# Patient Record
Sex: Male | Born: 1975 | Race: White | Hispanic: No | Marital: Married | Smoking: Never smoker
Health system: Southern US, Community
[De-identification: ages and names within clinical notes are randomized; demographics above are authoritative.]

## PROBLEM LIST (undated history)

## (undated) HISTORY — PX: FRACTURE SURGERY: SHX138

## (undated) HISTORY — PX: CHOLECYSTECTOMY: SHX55

---

## 2006-07-08 ENCOUNTER — Emergency Department: Payer: Self-pay | Admitting: Internal Medicine

## 2013-10-28 ENCOUNTER — Ambulatory Visit: Payer: Self-pay | Admitting: Gastroenterology

## 2014-02-11 ENCOUNTER — Ambulatory Visit: Payer: Self-pay | Admitting: Surgery

## 2014-02-12 LAB — PATHOLOGY REPORT

## 2014-04-21 IMAGING — US ABDOMEN ULTRASOUND
1 series · 14 of 25 positions shown · non-contrast
Comparison: None.

CLINICAL DATA: Right upper quadrant abdominal pain

EXAM:
ULTRASOUND ABDOMEN COMPLETE

[Series 1: abdomen ultrasound · 0.26mm/px · 14 of 96 slices shown]
[im 1/96]
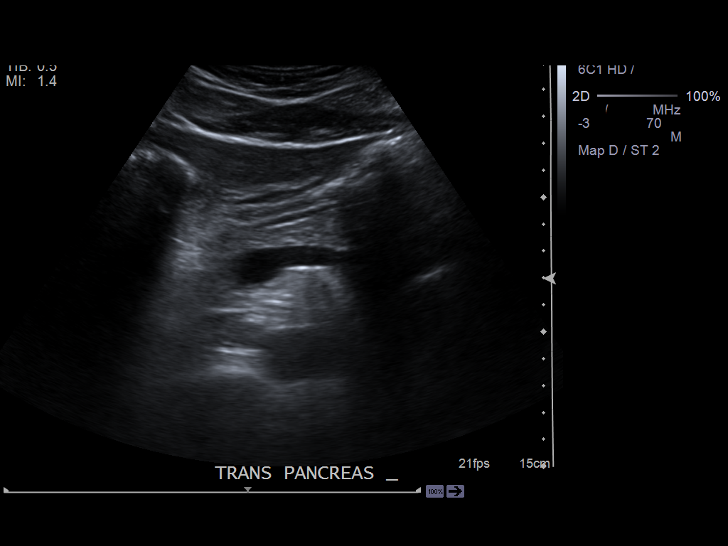
[im 8/96]
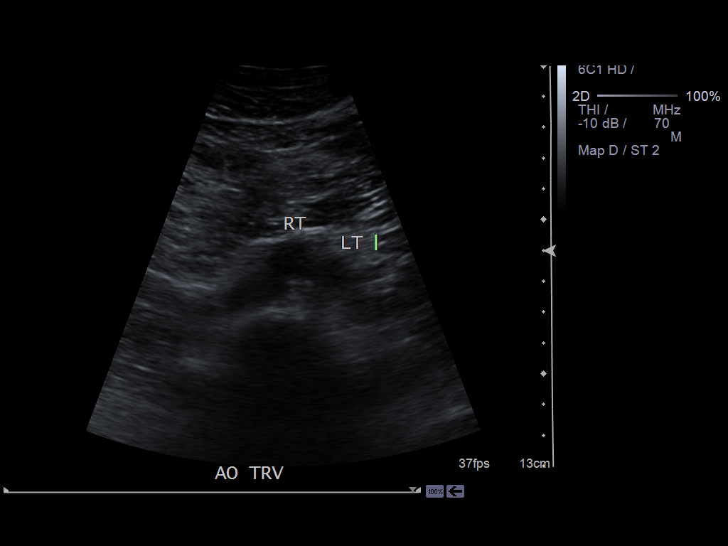
[im 16/96]
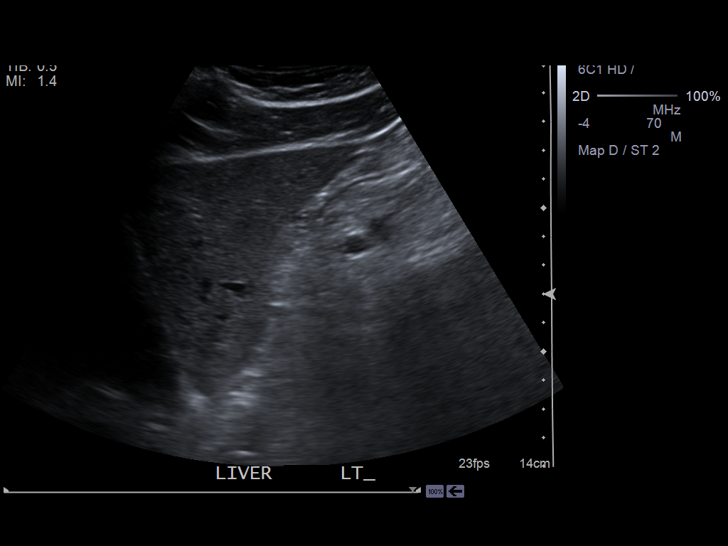
[im 24/96]
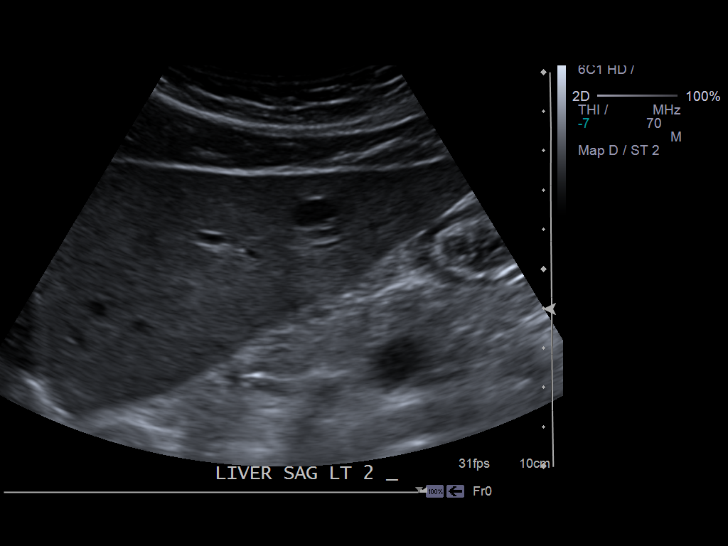
[im 32/96]
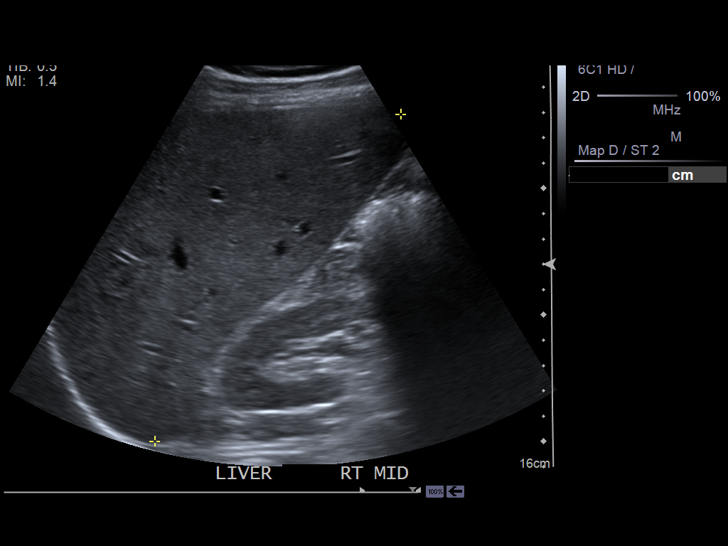
[im 36/96]
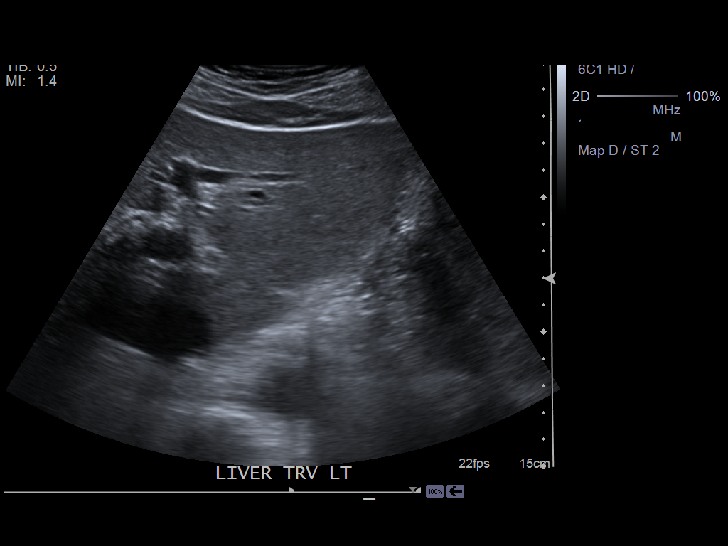
[im 44/96]
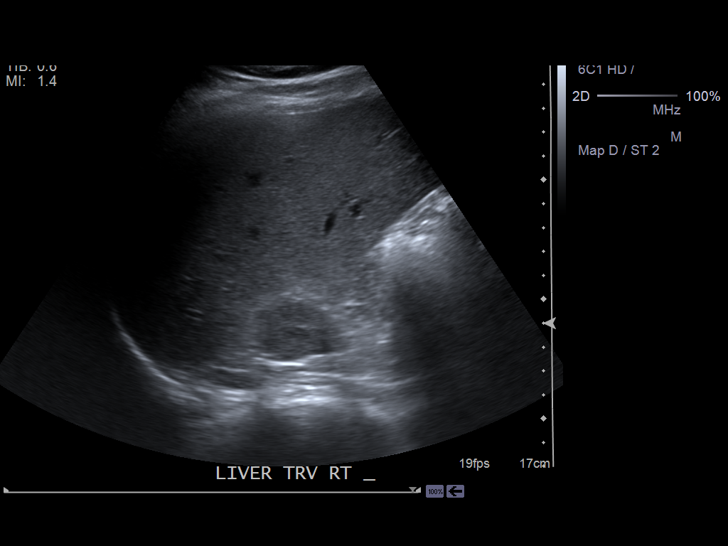
[im 52/96]
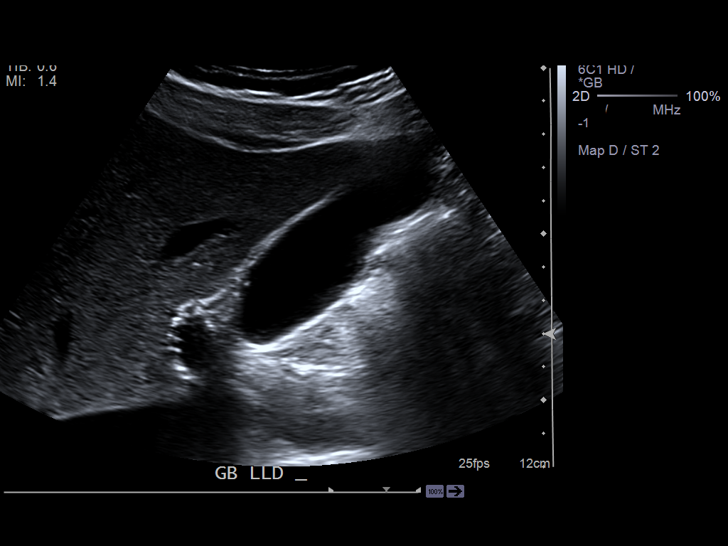
[im 60/96]
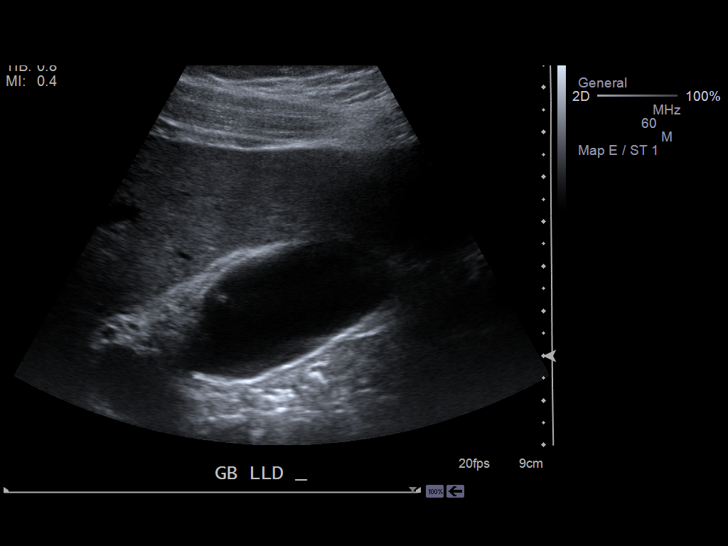
[im 64/96]
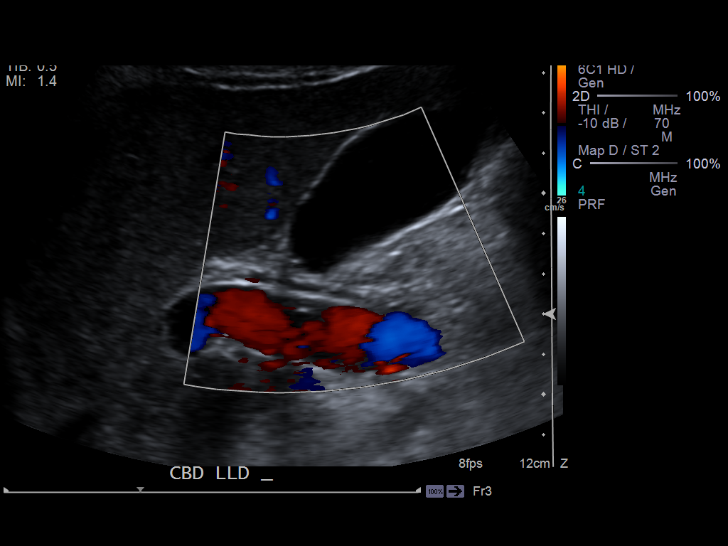
[im 72/96]
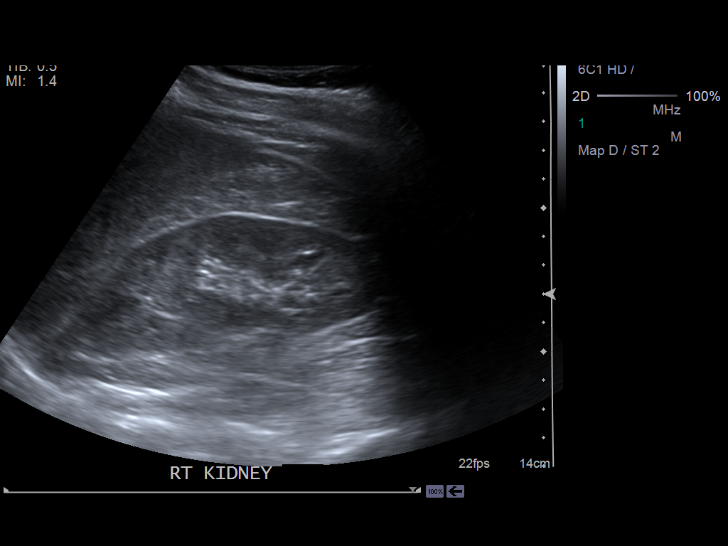
[im 80/96]
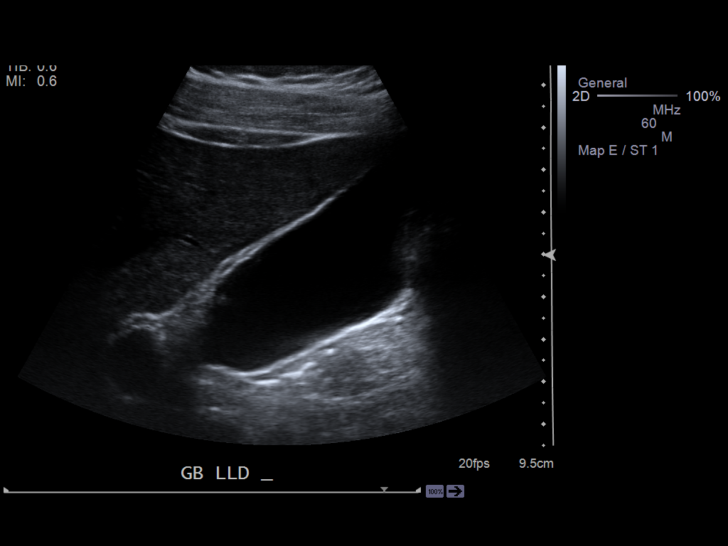
[im 88/96]
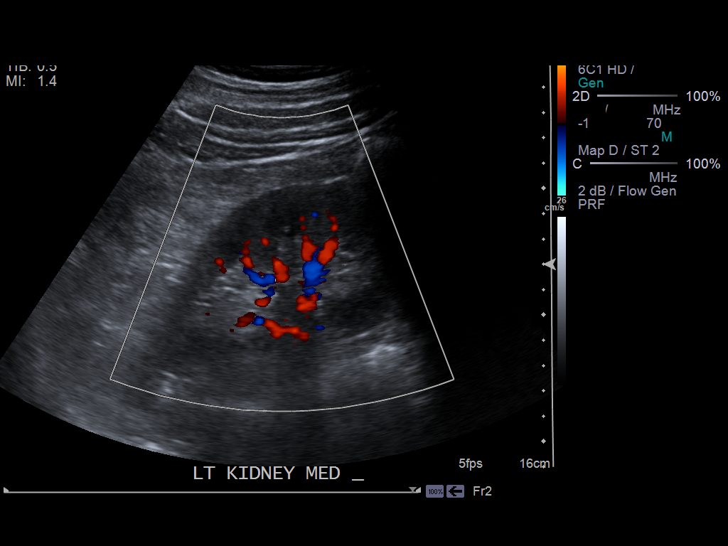
[im 96/96]
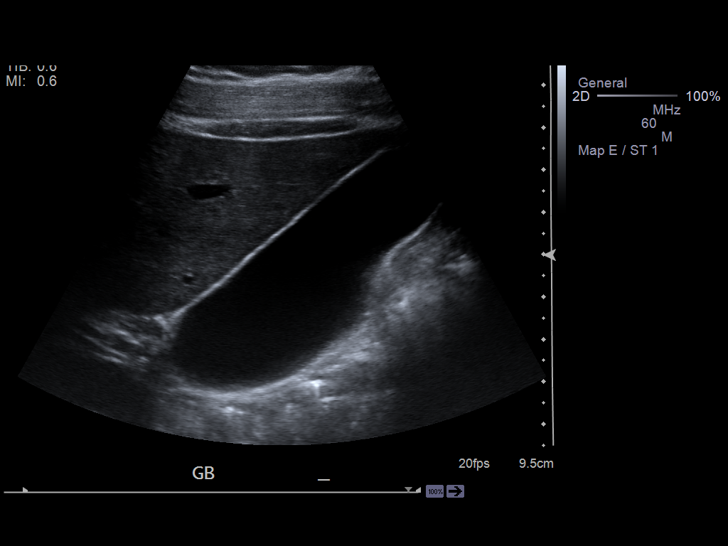

[14 of 25 positions shown; findings below may reference images not displayed]

FINDINGS: Gallbladder

3 mm gallbladder polyp versus adherent gallstone. No gallbladder
wall thickening or pericholecystic fluid. Negative sonographic
Murphy's sign.

Common bile duct

Diameter: 2 mm.

Liver

Small hepatic cysts measuring up to 1.5 x 1.2 x 0.8 cm in the left
hepatic lobe.

IVC

No abnormality visualized.

Pancreas

Visualized portions are unremarkable.

Spleen

Measures 7.2 cm.

Right Kidney

Length: 11.7 cm.  No mass or hydronephrosis.

Left Kidney

Length: 11.6 cm.  No mass or hydronephrosis.

Abdominal aorta

No aneurysm visualized.
IMPRESSION: 3 mm gallbladder polyp versus adherent gallstone.

No associated sonographic findings to suggest acute cholecystitis.

## 2014-08-05 IMAGING — CR DG CHOLANGIOGRAM OPERATIVE
1 series · 7 of 7 positions shown · non-contrast
Comparison: US ABDOMEN COMPLETE dated 10/28/2013

CLINICAL DATA: Cholecystectomy.

EXAM:
INTRAOPERATIVE CHOLANGIOGRAM
TECHNIQUE: Cholangiographic images from the C-arm fluoroscopic device were
submitted for interpretation post-operatively. Please see the
procedural report for the amount of contrast and the fluoroscopy
time utilized.

[Series 8: cont. · 7 of 7 frames shown]
[frame 1/7]
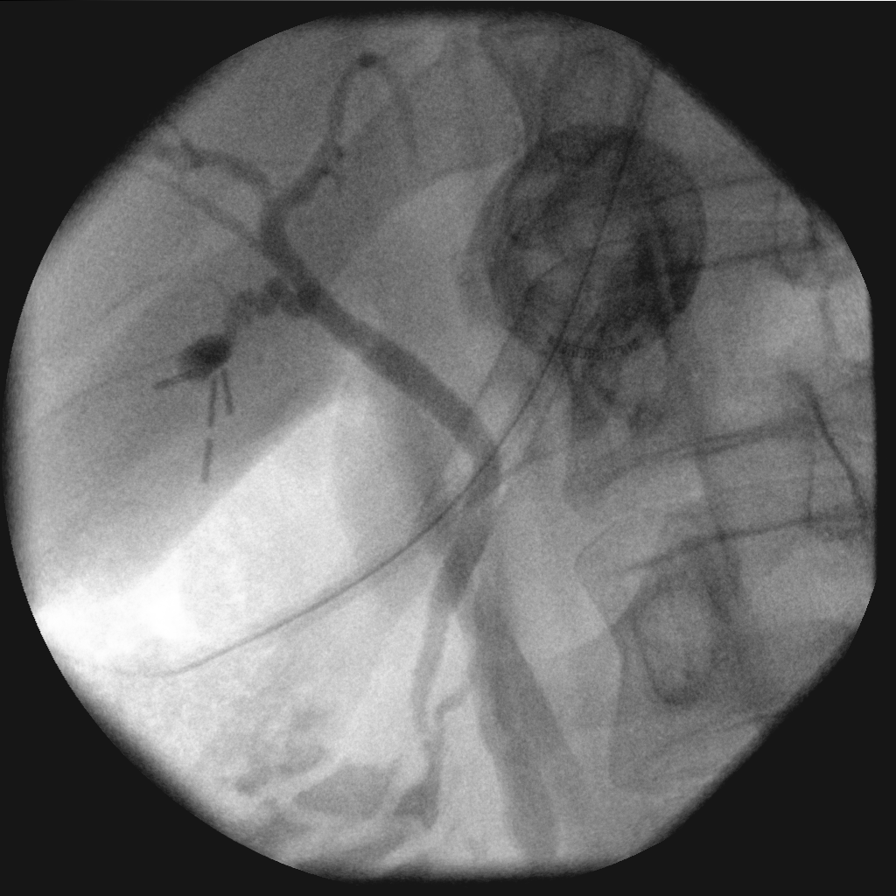
[frame 2/7]
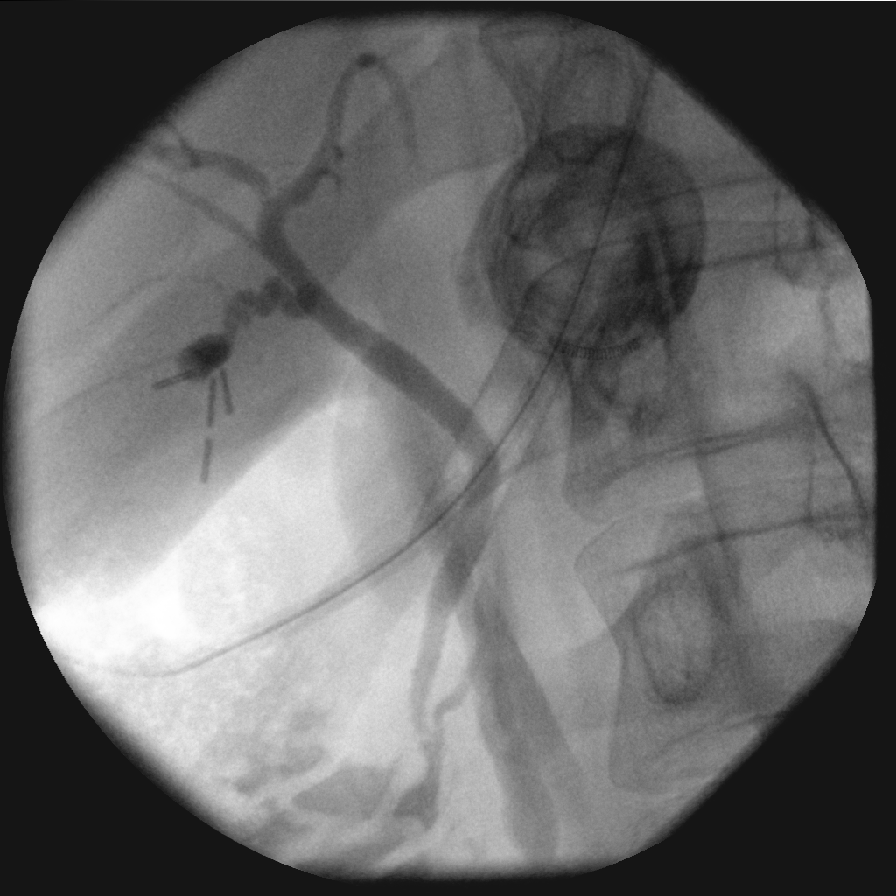
[frame 3/7]
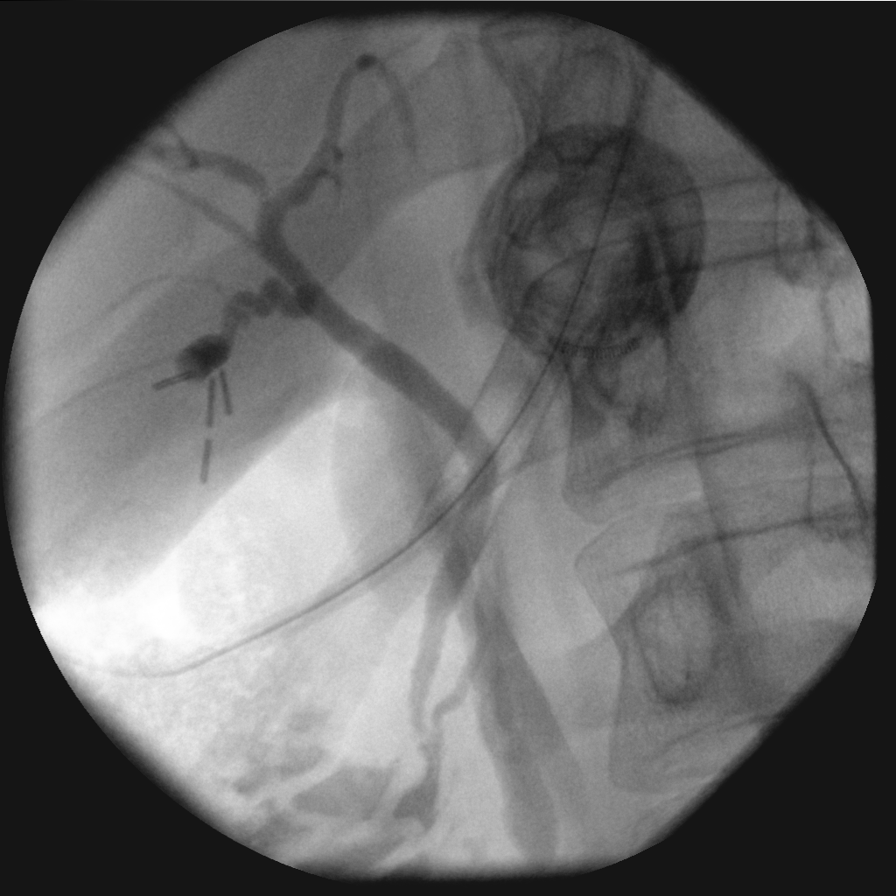
[frame 4/7]
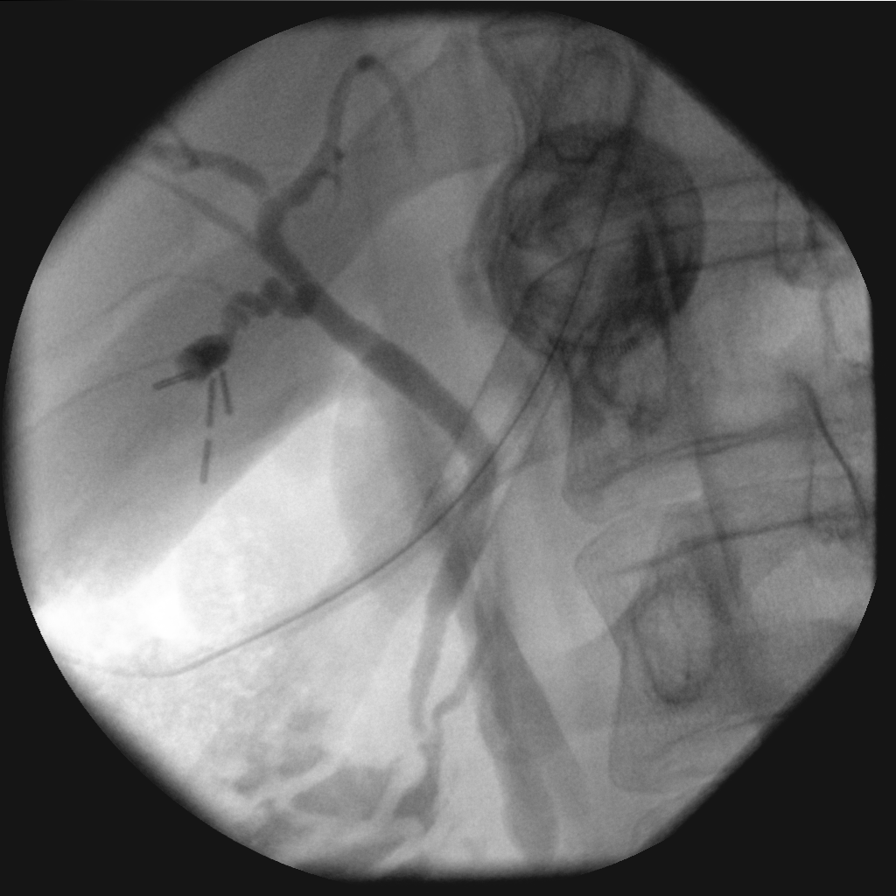
[frame 5/7]
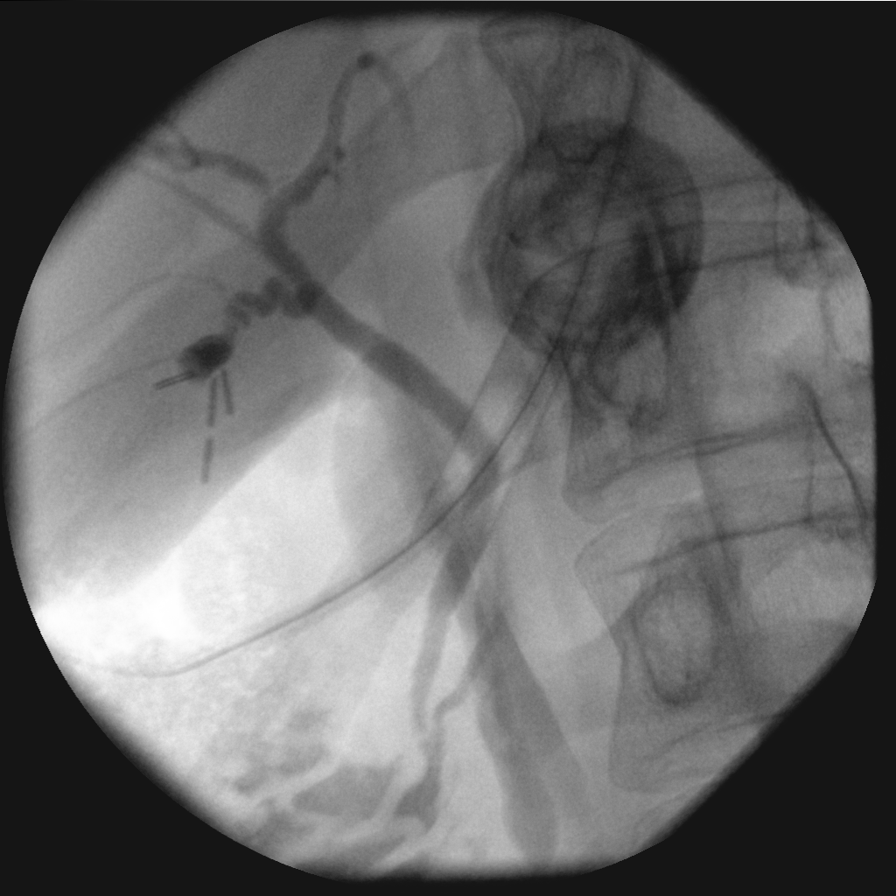
[frame 6/7]
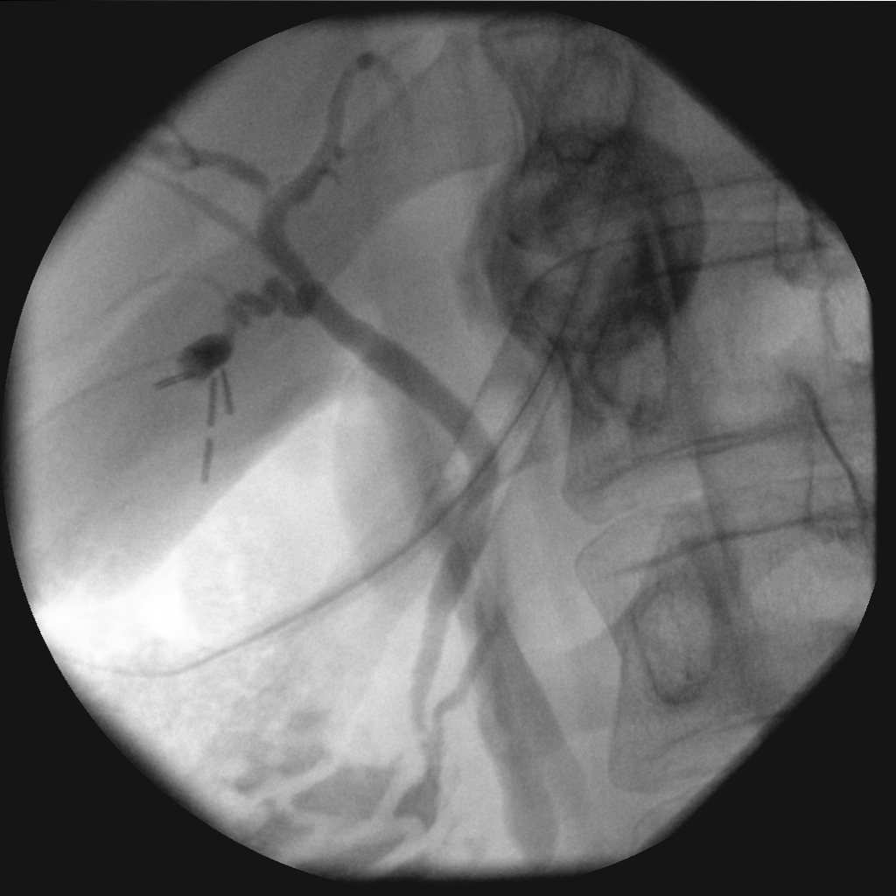
[frame 7/7]
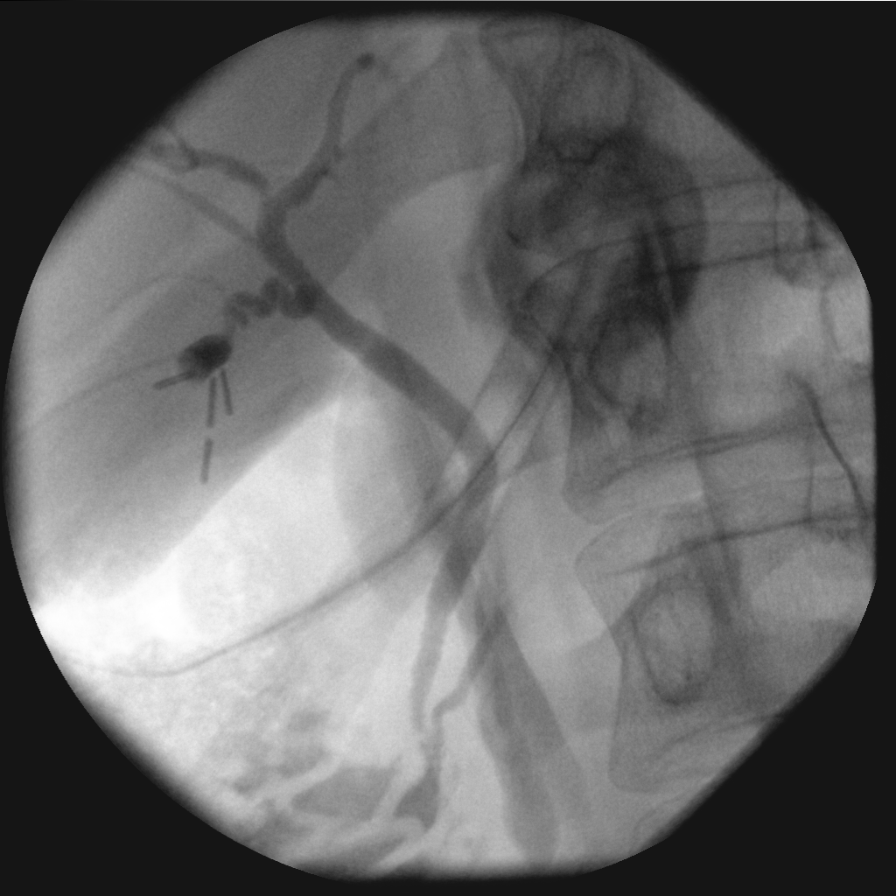

[7 of 7 positions shown; findings below may reference images not displayed]

FINDINGS: There is contrast within the common bile duct and central
intrahepatic ducts. There is minor filling of the main pancreatic
duct. There is a small non obstructive filling defect near the
distal common bile duct. This finding is indeterminate.
IMPRESSION: Biliary system is patent without dilatation. However, there is a
nonobstructive filling defect in the distal common bile duct. A
small nonobstructive stone or sludge cannot be excluded at this
location.

## 2015-03-26 NOTE — Op Note (Signed)
PATIENT NAME:  GURSHAAN, MATSUOKA MR#:  [id number] DATE OF BIRTH:  04/30/1976  DATE OF PROCEDURE:  02/11/2014  PREOPERATIVE DIAGNOSIS: Chronic cholecystitis.   POSTOPERATIVE DIAGNOSIS: Chronic cholecystitis, cholelithiasis.   PROCEDURE: Laparoscopic cholecystectomy, cholangiogram.   SURGEON: Renda Rolls, MD  ANESTHESIA: General.   INDICATIONS: This 39 year old male has a history of pains in the region of the right lower lateral rib cage. He did have ultrasound findings of a single particulate bit of material in the gallbladder interpreted as either stone or polyp and these images were reviewed and found to be readily apparent and surgery recommended for definitive treatment.   DESCRIPTION OF PROCEDURE: The patient was placed on the operating table in the supine position under general anesthesia. The abdomen was prepared with clippers and with ChloraPrep and draped in a sterile manner.   A short incision was made in the inferior aspect of the umbilicus and carried down to the deep fascia which was grasped with laryngeal hook and elevated. A Veress needle was inserted, aspirated, and irrigated with a saline solution. Next, the peritoneal cavity was inflated with carbon dioxide. The Veress needle was removed. The 10 mm cannula was inserted. The 10 mm, 0 degree laparoscope was inserted to view the peritoneal cavity. The liver appeared normal. Visible intestines and stomach appeared to be normal. Another incision was made in the epigastrium slightly to the right of the midline to introduce an 11 mm cannula. Two incisions were made in the lateral aspect of the right upper quadrant to introduce two 5 mm cannulas.   The gallbladder was retracted towards the right shoulder. There were multiple adhesions between the omentum and the gallbladder which were taken down with blunt and sharp dissection and use of electrocautery. The gallbladder neck was mobilized with incision of the visceral peritoneum. The cystic  duct was dissected free from surrounding structures. The porta hepatis was noted. The cystic artery was dissected free from surrounding structures. A critical view of safety was demonstrated. The cystic artery was controlled with double endoclips and divided allowing better traction on the cystic duct and then an Endoclip was placed across the cystic duct adjacent to the neck of the gallbladder. An incision was made in the cystic duct to introduce a Reddick catheter. Half-strength Conray-60 dye was injected as the cholangiogram was done with fluoroscopy viewing the biliary tree and flow of dye into the duodenum. The pancreatic duct was also seen. There were no retained stones and the cholangiogram appeared normal. The cholangiocatheter was removed. The cystic duct was doubly ligated with endoclips and divided. The gallbladder was dissected free from the liver with hook and cautery. A small amount of blood was aspirated. The site was irrigated with heparinized saline solution and aspirated. The gallbladder was completely separated. Hemostasis was subsequently intact. The gallbladder was delivered up through the infraumbilical incision, opened and suctioned and did view a small stone that appeared adherent to the neck of the gallbladder and also evidence of cholesterolosis. The gallbladder was submitted with the stone for routine pathology. The right upper quadrant was further inspected. Hemostasis was intact. The cannulas were removed allowing carbon dioxide to escape from the peritoneal cavity. Several small subcutaneous bleeding points were cauterized. The wounds were closed with interrupted 5-0 chromic subcuticular sutures, benzoin, and Steri-Strips. Dressings were applied with paper tape. The patient tolerated surgery satisfactorily and was then prepared for transfer to the recovery room. ____________________________ Shela Commons. Renda Rolls, MD jws:sb D: 02/11/2014 09:10:11 ET T: 02/11/2014 09:49:50  ET  JOB#: [id number]  cc: Adella [id number]. Wilton Smith, MD, <Dictator> Adella HareWILTON J SMITH MD ELECTRONICALLY SIGNED 02/17/2014 17:46

## 2023-01-03 ENCOUNTER — Ambulatory Visit
Admission: RE | Admit: 2023-01-03 | Discharge: 2023-01-03 | Disposition: A | Discharge status: Home / Self Care | Payer: 59 | Attending: Gastroenterology | Admitting: Gastroenterology

## 2023-01-03 ENCOUNTER — Ambulatory Visit: Payer: 59 | Admitting: Anesthesiology

## 2023-01-03 ENCOUNTER — Other Ambulatory Visit: Payer: Self-pay

## 2023-01-03 ENCOUNTER — Encounter: Payer: Self-pay | Admitting: Gastroenterology

## 2023-01-03 ENCOUNTER — Encounter
Admission: RE | Disposition: A | Discharge status: Home / Self Care | Payer: Self-pay | Source: Home / Self Care | Attending: Gastroenterology

## 2023-01-03 DIAGNOSIS — Z1211 Encounter for screening for malignant neoplasm of colon: Secondary | ICD-10-CM | POA: Diagnosis present

## 2023-01-03 DIAGNOSIS — Z9049 Acquired absence of other specified parts of digestive tract: Secondary | ICD-10-CM | POA: Diagnosis not present

## 2023-01-03 DIAGNOSIS — K621 Rectal polyp: Secondary | ICD-10-CM | POA: Diagnosis not present

## 2023-01-03 HISTORY — PX: COLONOSCOPY: SHX5424

## 2023-01-03 SURGERY — COLONOSCOPY
Anesthesia: General

## 2023-01-03 MED ORDER — LIDOCAINE HCL (CARDIAC) PF 100 MG/5ML IV SOSY
PREFILLED_SYRINGE | INTRAVENOUS | Status: DC | PRN
  Administered 2023-01-03: 100 mg via INTRAVENOUS

## 2023-01-03 MED ORDER — MIDAZOLAM HCL 2 MG/2ML IJ SOLN
INTRAMUSCULAR | Status: DC | PRN
  Administered 2023-01-03: 2 mg via INTRAVENOUS

## 2023-01-03 MED ORDER — PROPOFOL 10 MG/ML IV BOLUS
INTRAVENOUS | Status: DC | PRN
  Administered 2023-01-03: 20 mg via INTRAVENOUS
  Administered 2023-01-03: 70 mg via INTRAVENOUS
  Administered 2023-01-03: 20 mg via INTRAVENOUS
  Administered 2023-01-03: 30 mg via INTRAVENOUS
  Administered 2023-01-03: 20 mg via INTRAVENOUS

## 2023-01-03 MED ORDER — PROPOFOL 1000 MG/100ML IV EMUL
INTRAVENOUS | Status: AC
  Filled 2023-01-03: qty 500

## 2023-01-03 MED ORDER — PROPOFOL 500 MG/50ML IV EMUL
INTRAVENOUS | Status: DC | PRN
  Administered 2023-01-03: 165 ug/kg/min via INTRAVENOUS

## 2023-01-03 MED ORDER — SODIUM CHLORIDE 0.9 % IV SOLN: INTRAVENOUS | Status: DC

## 2023-01-03 MED ORDER — MIDAZOLAM HCL 2 MG/2ML IJ SOLN
INTRAMUSCULAR | Status: AC
  Filled 2023-01-03: qty 2

## 2023-01-03 NOTE — Transfer of Care (Signed)
Immediate Anesthesia Transfer of Care Note  Patient: Alexander Floyd  Procedure(s) Performed: COLONOSCOPY  Patient Location: Endoscopy Unit  Anesthesia Type:General  Level of Consciousness: drowsy and patient cooperative  Airway & Oxygen Therapy: Patient Spontanous Breathing and Patient connected to face mask oxygen  Post-op Assessment: Report given to RN and Post -op Vital signs reviewed and stable  Post vital signs: Reviewed and stable  Last Vitals:  Vitals Value Taken Time  BP 104/58 01/03/23 0826  Temp 35.7 C 01/03/23 0825  Pulse 69 01/03/23 0827  Resp 10 01/03/23 0827  SpO2 100 % 01/03/23 0827  Vitals shown include unvalidated device data.  Last Pain:  Vitals:   01/03/23 0825  TempSrc: Temporal  PainSc: Asleep         Complications: No notable events documented.

## 2023-01-03 NOTE — H&P (Signed)
Pre-Procedure H&P   Patient ID: Alexander Floyd is a 47 y.o. male.  Gastroenterology Provider: Annamaria Helling, DO  PCP: Patient, No Pcp Per  Date: 01/03/2023  HPI Alexander Floyd is a 47 y.o. male who presents today for Colonoscopy for Screening colonoscopy .  Rare blood when he wipes. Hgb 14.5 mcv 83 plt 292  S/p cholecystectomy   History reviewed. No pertinent past medical history.  Past Surgical History:  Procedure Laterality Date   CHOLECYSTECTOMY     FRACTURE SURGERY     Jaw repair, metal present    Family History No h/o GI disease or malignancy  Review of Systems  Constitutional:  Negative for activity change, appetite change, chills, diaphoresis, fatigue, fever and unexpected weight change.  HENT:  Negative for trouble swallowing and voice change.   Respiratory:  Negative for shortness of breath and wheezing.   Cardiovascular:  Negative for chest pain, palpitations and leg swelling.  Gastrointestinal:  Negative for abdominal distention, abdominal pain, anal bleeding, blood in stool, constipation, diarrhea, nausea and vomiting.  Musculoskeletal:  Negative for arthralgias and myalgias.  Skin:  Negative for color change and pallor.  Neurological:  Negative for dizziness, syncope and weakness.  Psychiatric/Behavioral:  Negative for confusion. The patient is not nervous/anxious.   All other systems reviewed and are negative.    Medications No current facility-administered medications on file prior to encounter.   Current Outpatient Medications on File Prior to Encounter  Medication Sig Dispense Refill   cyclobenzaprine (FLEXERIL) 10 MG tablet Take 10 mg by mouth 3 (three) times daily as needed for muscle spasms.     fluticasone (FLONASE) 50 MCG/ACT nasal spray Place into both nostrils daily.     loratadine (CLARITIN REDITABS) 10 MG dissolvable tablet Take 10 mg by mouth daily.      Pertinent medications related to GI and procedure were reviewed by me  with the patient prior to the procedure   Current Facility-Administered Medications:    0.9 %  sodium chloride infusion, , Intravenous, Continuous, Annamaria Helling, DO, Last Rate: 20 mL/hr at 01/03/23 0715, New Bag at 01/03/23 0715  sodium chloride 20 mL/hr at 01/03/23 0715       No Known Allergies Allergies were reviewed by me prior to the procedure  Objective   Body mass index is 30.08 kg/m. Vitals:   01/03/23 0704  BP: 109/73  Pulse: 77  Resp: (!) 100  Temp: (!) 97.1 F (36.2 C)  TempSrc: Temporal  SpO2: 100%  Weight: 103.4 kg  Height: 6\' 1"  (1.854 m)     Physical Exam Vitals and nursing note reviewed.  Constitutional:      General: He is not in acute distress.    Appearance: Normal appearance. He is not ill-appearing, toxic-appearing or diaphoretic.  HENT:     Head: Normocephalic and atraumatic.     Nose: Nose normal.     Mouth/Throat:     Mouth: Mucous membranes are moist.     Pharynx: Oropharynx is clear.  Eyes:     General: No scleral icterus.    Extraocular Movements: Extraocular movements intact.  Cardiovascular:     Rate and Rhythm: Normal rate and regular rhythm.     Heart sounds: Normal heart sounds. No murmur heard.    No friction rub. No gallop.  Pulmonary:     Effort: Pulmonary effort is normal. No respiratory distress.     Breath sounds: Normal breath sounds. No wheezing, rhonchi or rales.  Abdominal:     General: Bowel sounds are normal. There is no distension.     Palpations: Abdomen is soft.     Tenderness: There is no abdominal tenderness. There is no guarding or rebound.  Musculoskeletal:     Cervical back: Neck supple.     Right lower leg: No edema.     Left lower leg: No edema.  Skin:    General: Skin is warm and dry.     Coloration: Skin is not jaundiced or pale.  Neurological:     General: No focal deficit present.     Mental Status: He is alert and oriented to person, place, and time. Mental status is at baseline.   Psychiatric:        Mood and Affect: Mood normal.        Behavior: Behavior normal.        Thought Content: Thought content normal.        Judgment: Judgment normal.      Assessment:  Alexander Floyd is a 47 y.o. male  who presents today for Colonoscopy for Screening colonoscopy .  Plan:  Colonoscopy with possible intervention today  Colonoscopy with possible biopsy, control of bleeding, polypectomy, and interventions as necessary has been discussed with the patient/patient representative. Informed consent was obtained from the patient/patient representative after explaining the indication, nature, and risks of the procedure including but not limited to death, bleeding, perforation, missed neoplasm/lesions, cardiorespiratory compromise, and reaction to medications. Opportunity for questions was given and appropriate answers were provided. Patient/patient representative has verbalized understanding is amenable to undergoing the procedure.   Annamaria Helling, DO  [facility]  Portions of the record may have been created with voice recognition software. Occasional wrong-word or 'sound-a-like' substitutions may have occurred due to the inherent limitations of voice recognition software.  Read the chart carefully and recognize, using context, where substitutions may have occurred.

## 2023-01-03 NOTE — Anesthesia Preprocedure Evaluation (Signed)
Anesthesia Evaluation  Patient identified by MRN, date of birth, ID band Patient awake    Reviewed: Allergy & Precautions, NPO status , Patient's Chart, lab work & pertinent test results  History of Anesthesia Complications Negative for: history of anesthetic complications  Airway Mallampati: II  TM Distance: >3 FB Neck ROM: Full    Dental no notable dental hx. (+) Teeth Intact   Pulmonary neg pulmonary ROS, neg sleep apnea, neg COPD, Patient abstained from smoking.Not current smoker   Pulmonary exam normal breath sounds clear to auscultation       Cardiovascular Exercise Tolerance: Good METS(-) hypertension(-) CAD and (-) Past MI negative cardio ROS (-) dysrhythmias  Rhythm:Regular Rate:Normal - Systolic murmurs    Neuro/Psych negative neurological ROS  negative psych ROS   GI/Hepatic ,neg GERD  ,,(+)     (-) substance abuse    Endo/Other  neg diabetes    Renal/GU negative Renal ROS     Musculoskeletal   Abdominal   Peds  Hematology   Anesthesia Other Findings History reviewed. No pertinent past medical history.  Reproductive/Obstetrics                             Anesthesia Physical Anesthesia Plan  ASA: 1  Anesthesia Plan: General   Post-op Pain Management: Minimal or no pain anticipated   Induction: Intravenous  PONV Risk Score and Plan: 2 and Propofol infusion, TIVA and Ondansetron  Airway Management Planned: Nasal Cannula  Additional Equipment: None  Intra-op Plan:   Post-operative Plan:   Informed Consent: I have reviewed the patients History and Physical, chart, labs and discussed the procedure including the risks, benefits and alternatives for the proposed anesthesia with the patient or authorized representative who has indicated his/her understanding and acceptance.     Dental advisory given  Plan Discussed with: CRNA and Surgeon  Anesthesia Plan Comments:  (Discussed risks of anesthesia with patient, including possibility of difficulty with spontaneous ventilation under anesthesia necessitating airway intervention, PONV, and rare risks such as cardiac or respiratory or neurological events, and allergic reactions. Discussed the role of CRNA in patient's perioperative care. Patient understands.)       Anesthesia Quick Evaluation  

## 2023-01-03 NOTE — Op Note (Signed)
[facility] Gastroenter[facility] Patient Name: Alexander Floyd Procedure Date: 01/03/2023 7:46 AM MRN: [id number] Account #: [id number] Date of Birth: 01-Sep-1976 Admit Type: Outpatient Age: 47 Room: [facility] ENDO ROOM 1 Gender: Male Note Status: Finalized Instrument Name: Colonscope [id number] Procedure:             Colonoscopy Indications:           Screening for colorectal malignant neoplasm Providers:             Rueben Bash, DO Referring MD:          Youlanda Roys. Lovie Macadamia, MD (Referring MD) Medicines:             Monitored Anesthesia Care Complications:         No immediate complications. Estimated blood loss:                         Minimal. Procedure:             Pre-Anesthesia Assessment:                        - Prior to the procedure, a History and Physical was                         performed, and patient medications and allergies were                         reviewed. The patient is competent. The risks and                         benefits of the procedure and the sedation options and                         risks were discussed with the patient. All questions                         were answered and informed consent was obtained.                         Patient identification and proposed procedure were                         verified by the physician, the nurse, the anesthetist                         and the technician in the endoscopy suite. Mental                         Status Examination: alert and oriented. Airway                         Examination: normal oropharyngeal airway and neck                         mobility. Respiratory Examination: clear to                         auscultation. CV Examination: RRR, no murmurs, no S3  or S4. Prophylactic Antibiotics: The patient does not                         require prophylactic antibiotics. Prior                         Anticoagulants: The patient has taken no anticoagulant                          or antiplatelet agents. ASA Grade Assessment: I - A                         normal, healthy patient. After reviewing the risks and                         benefits, the patient was deemed in satisfactory                         condition to undergo the procedure. The anesthesia                         plan was to use monitored anesthesia care (MAC).                         Immediately prior to administration of medications,                         the patient was re-assessed for adequacy to receive                         sedatives. The heart rate, respiratory rate, oxygen                         saturations, blood pressure, adequacy of pulmonary                         ventilation, and response to care were monitored                         throughout the procedure. The physical status of the                         patient was re-assessed after the procedure.                        After obtaining informed consent, the colonoscope was                         passed under direct vision. Throughout the procedure,                         the patient's blood pressure, pulse, and oxygen                         saturations were monitored continuously. The                         Colonoscope was introduced through the anus and  advanced to the the terminal ileum, with                         identification of the appendiceal orifice and IC                         valve. The colonoscopy was performed without                         difficulty. The patient tolerated the procedure well.                         The quality of the bowel preparation was evaluated                         using the BBPS [facility] Bowel Preparation Scale) with                         scores of: Right Colon = 3, Transverse Colon = 3 and                         Left Colon = 3 (entire mucosa seen well with no                         residual staining, small fragments of stool or  opaque                         liquid). The total BBPS score equals 9. The terminal                         ileum, ileocecal valve, appendiceal orifice, and                         rectum were photographed. Findings:      The perianal and digital rectal examinations were normal. Pertinent       negatives include normal sphincter tone.      The terminal ileum appeared normal. Estimated blood loss: none.      Retroflexion in the right colon was performed.      Four sessile polyps were found in the rectum, transverse colon,       ascending colon and cecum. The polyps were 1 to 2 mm in size.      A 3 to 4 mm polyp was found in the ascending colon. The polyp was       sessile. The polyp was removed with a cold snare. Resection and       retrieval were complete. Estimated blood loss was minimal.      The exam was otherwise without abnormality on direct and retroflexion       views. Impression:            - The examined portion of the ileum was normal.                        - Four 1 to 2 mm polyps in the rectum, in the                         transverse colon, in the ascending colon  and in the                         cecum.                        - One 3 to 4 mm polyp in the ascending colon, removed                         with a cold snare. Resected and retrieved.                        - The examination was otherwise normal on direct and                         retroflexion views. Recommendation:        - Patient has a contact number available for                         emergencies. The signs and symptoms of potential                         delayed complications were discussed with the patient.                         Return to normal activities tomorrow. Written                         discharge instructions were provided to the patient.                        - Discharge patient to home.                        - Resume previous diet.                        - Continue present  medications.                        - Await pathology results.                        - Repeat colonoscopy for surveillance based on                         pathology results.                        - Return to referring physician as previously                         scheduled.                        - No ibuprofen, naproxen, or other non-steroidal                         anti-inflammatory drugs.                        - The findings and recommendations were discussed with  the patient. Procedure Code(s):     --- Professional ---                        [phone], Colonoscopy, flexible; with removal of                         tumor(s), polyp(s), or other lesion(s) by snare                         technique Diagnosis Code(s):     --- Professional ---                        Z12.11, Encounter for screening for malignant neoplasm                         of colon                        D12.8, Benign neoplasm of rectum                        D12.3, Benign neoplasm of transverse colon (hepatic                         flexure or splenic flexure)                        D12.2, Benign neoplasm of ascending colon                        D12.0, Benign neoplasm of cecum CPT copyright 2022 American Medical Association. All rights reserved. The codes documented in this report are preliminary and upon coder review may  be revised to meet current compliance requirements. Attending Participation:      I personally performed the entire procedure. Volney American, DO Annamaria Helling DO, DO 01/03/2023 8:27:18 AM This report has been signed electronically. Number of Addenda: 0 Note Initiated On: 01/03/2023 7:46 AM Scope Withdrawal Time: 0 hours 19 minutes 35 seconds  Total Procedure Duration: 0 hours 26 minutes 54 seconds  Estimated Blood Loss:  Estimated blood loss was minimal.      [facility]

## 2023-01-03 NOTE — Anesthesia Postprocedure Evaluation (Signed)
Anesthesia Post Note  Patient: Alexander Floyd  Procedure(s) Performed: COLONOSCOPY  Patient location during evaluation: Endoscopy Anesthesia Type: General Level of consciousness: awake and alert Pain management: pain level controlled Vital Signs Assessment: post-procedure vital signs reviewed and stable Respiratory status: spontaneous breathing, nonlabored ventilation, respiratory function stable and patient connected to nasal cannula oxygen Cardiovascular status: blood pressure returned to baseline and stable Postop Assessment: no apparent nausea or vomiting Anesthetic complications: no   No notable events documented.   Last Vitals:  Vitals:   01/03/23 0704 01/03/23 0825  BP: 109/73 (!) 104/58  Pulse: 77 68  Resp: 16 12  Temp: (!) 36.2 C (!) 35.7 C  SpO2: 100% 100%    Last Pain:  Vitals:   01/03/23 0825  TempSrc: Temporal  PainSc: Asleep                 Arita Miss

## 2023-01-03 NOTE — Interval H&P Note (Signed)
History and Physical Interval Note: Preprocedure H&P from 01/03/23  was reviewed and there was no interval change after seeing and examining the patient.  Written consent was obtained from the patient after discussion of risks, benefits, and alternatives. Patient has consented to proceed with Colonoscopy with possible intervention   01/03/2023 7:44 AM  Alexander Floyd  has presented today for surgery, with the diagnosis of Encounter for screening colonoscopy for non-high-risk patient (Z12.11).  The various methods of treatment have been discussed with the patient and family. After consideration of risks, benefits and other options for treatment, the patient has consented to  Procedure(s): COLONOSCOPY (N/A) as a surgical intervention.  The patient's history has been reviewed, patient examined, no change in status, stable for surgery.  I have reviewed the patient's chart and labs.  Questions were answered to the patient's satisfaction.     Annamaria Helling

## 2023-01-03 NOTE — Anesthesia Procedure Notes (Signed)
Procedure Name: General with mask airway Date/Time: 01/03/2023 7:56 AM  Performed by: Kelton Pillar, CRNAPre-anesthesia Checklist: Patient identified, Emergency Drugs available, Suction available and Patient being monitored Patient Re-evaluated:Patient Re-evaluated prior to induction Oxygen Delivery Method: Simple face mask Induction Type: IV induction Placement Confirmation: positive ETCO2, CO2 detector and breath sounds checked- equal and bilateral Dental Injury: Teeth and Oropharynx as per pre-operative assessment

## 2023-01-04 ENCOUNTER — Encounter: Payer: Self-pay | Admitting: Gastroenterology

## 2023-01-04 LAB — SURGICAL PATHOLOGY
